# Patient Record
Sex: Male | Born: 1990 | Race: White | Hispanic: No | Marital: Married | Smoking: Never smoker
Health system: Southern US, Community
[De-identification: ages and names within clinical notes are randomized; demographics above are authoritative.]

## PROBLEM LIST (undated history)

## (undated) ENCOUNTER — Ambulatory Visit: Source: Home / Self Care

---
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
## 2010-12-22 ENCOUNTER — Encounter: Payer: Self-pay | Admitting: Pediatrics

## 2014-04-21 ENCOUNTER — Encounter: Payer: Self-pay | Admitting: Internal Medicine

## 2018-07-19 DIAGNOSIS — Z3141 Encounter for fertility testing: Secondary | ICD-10-CM | POA: Diagnosis not present

## 2018-07-22 DIAGNOSIS — R82998 Other abnormal findings in urine: Secondary | ICD-10-CM | POA: Diagnosis not present

## 2018-07-22 DIAGNOSIS — Z0000 Encounter for general adult medical examination without abnormal findings: Secondary | ICD-10-CM | POA: Diagnosis not present

## 2018-08-03 DIAGNOSIS — R740 Nonspecific elevation of levels of transaminase and lactic acid dehydrogenase [LDH]: Secondary | ICD-10-CM | POA: Diagnosis not present

## 2018-08-03 DIAGNOSIS — Z0000 Encounter for general adult medical examination without abnormal findings: Secondary | ICD-10-CM | POA: Diagnosis not present

## 2018-09-16 DIAGNOSIS — R740 Nonspecific elevation of levels of transaminase and lactic acid dehydrogenase [LDH]: Secondary | ICD-10-CM | POA: Diagnosis not present

## 2018-09-16 DIAGNOSIS — Z7189 Other specified counseling: Secondary | ICD-10-CM | POA: Diagnosis not present

## 2018-09-16 DIAGNOSIS — K5909 Other constipation: Secondary | ICD-10-CM | POA: Diagnosis not present

## 2018-12-03 DIAGNOSIS — J111 Influenza due to unidentified influenza virus with other respiratory manifestations: Secondary | ICD-10-CM | POA: Diagnosis not present

## 2018-12-03 DIAGNOSIS — R05 Cough: Secondary | ICD-10-CM | POA: Diagnosis not present

## 2018-12-03 DIAGNOSIS — Z6826 Body mass index (BMI) 26.0-26.9, adult: Secondary | ICD-10-CM | POA: Diagnosis not present

## 2020-12-04 DIAGNOSIS — F422 Mixed obsessional thoughts and acts: Secondary | ICD-10-CM | POA: Diagnosis not present

## 2020-12-12 DIAGNOSIS — F422 Mixed obsessional thoughts and acts: Secondary | ICD-10-CM | POA: Diagnosis not present

## 2020-12-19 DIAGNOSIS — F422 Mixed obsessional thoughts and acts: Secondary | ICD-10-CM | POA: Diagnosis not present

## 2020-12-26 DIAGNOSIS — F422 Mixed obsessional thoughts and acts: Secondary | ICD-10-CM | POA: Diagnosis not present

## 2021-01-02 DIAGNOSIS — F422 Mixed obsessional thoughts and acts: Secondary | ICD-10-CM | POA: Diagnosis not present

## 2021-01-16 DIAGNOSIS — F422 Mixed obsessional thoughts and acts: Secondary | ICD-10-CM | POA: Diagnosis not present

## 2021-01-30 DIAGNOSIS — F422 Mixed obsessional thoughts and acts: Secondary | ICD-10-CM | POA: Diagnosis not present

## 2022-01-30 DIAGNOSIS — H10013 Acute follicular conjunctivitis, bilateral: Secondary | ICD-10-CM | POA: Diagnosis not present

## 2022-02-18 DIAGNOSIS — H1031 Unspecified acute conjunctivitis, right eye: Secondary | ICD-10-CM | POA: Diagnosis not present

## 2022-02-18 DIAGNOSIS — Z789 Other specified health status: Secondary | ICD-10-CM | POA: Diagnosis not present

## 2022-02-19 DIAGNOSIS — H1032 Unspecified acute conjunctivitis, left eye: Secondary | ICD-10-CM | POA: Diagnosis not present

## 2022-04-07 ENCOUNTER — Ambulatory Visit
Admission: RE | Admit: 2022-04-07 | Discharge: 2022-04-07 | Disposition: A | Discharge status: Home / Self Care | Payer: BC Managed Care – PPO | Source: Ambulatory Visit | Attending: Urgent Care | Admitting: Urgent Care

## 2022-04-07 VITALS — BP 137/68 | HR 68 | Temp 98.2°F | Resp 20

## 2022-04-07 DIAGNOSIS — L03213 Periorbital cellulitis: Secondary | ICD-10-CM

## 2022-04-07 MED ORDER — CEFDINIR 300 MG PO CAPS: 300.0000 mg | ORAL_CAPSULE | Freq: Two times a day (BID) | ORAL | 0 refills | Status: AC

## 2022-04-07 NOTE — ED Provider Notes (Signed)
?  [facility] ? ? ?MRN: [id number] DOB: [birth year] ? ?Subjective:  ? ?Shane Harvey is a 31 y.o. male presenting for 1 day history of right upper eyelid redness, swelling and tenderness.  Has left upper right Romycin ointment from a previous infected stye of the left upper eyelid.  No eye trauma, foreign body sensation, contact lens use.  Patient does recall that he was really sick about a month ago with his sinuses and coughing.  Feels like he is recovered mostly.  No difficulty with vision changes, photophobia. ? ?No current facility-administered medications for this encounter. ?No current outpatient medications on file.  ? ?No Known Allergies ? ?History reviewed. No pertinent past medical history.  ? ?History reviewed. No pertinent surgical history. ? ?Family History  ?Family history unknown: Yes  ? ? ?Social History  ? ?Tobacco Use  ? Smoking status: Never  ? Smokeless tobacco: Never  ?Substance Use Topics  ? Alcohol use: Never  ? Drug use: Never  ? ? ?ROS ? ? ?Objective:  ? ?Vitals: ?BP 137/68   Pulse 68   Temp 98.2 ?F (36.8 ?C)   Resp 20   SpO2 98%  ? ?Physical Exam ?Constitutional:   ?   General: He is not in acute distress. ?   Appearance: Normal appearance. He is well-developed and normal weight. He is not ill-appearing, toxic-appearing or diaphoretic.  ?HENT:  ?   Head: Normocephalic and atraumatic.  ?   Right Ear: External ear normal.  ?   Left Ear: External ear normal.  ?   Nose: Nose normal.  ?   Mouth/Throat:  ?   Pharynx: Oropharynx is clear.  ?Eyes:  ?   General: Lids are everted, no foreign bodies appreciated. No scleral icterus.    ?   Right eye: No foreign body, discharge or hordeolum.     ?   Left eye: No foreign body, discharge or hordeolum.  ?   Extraocular Movements: Extraocular movements intact.  ?   Conjunctiva/sclera:  ?   Right eye: Right conjunctiva is not injected. No chemosis, exudate or hemorrhage. ?   Left eye: Left conjunctiva is not injected. No  chemosis, exudate or hemorrhage. ? ?Cardiovascular:  ?   Rate and Rhythm: Normal rate.  ?Pulmonary:  ?   Effort: Pulmonary effort is normal.  ?Musculoskeletal:  ?   Cervical back: Normal range of motion.  ?Neurological:  ?   Mental Status: He is alert and oriented to person, place, and time.  ?Psychiatric:     ?   Mood and Affect: Mood normal.     ?   Behavior: Behavior normal.     ?   Thought Content: Thought content normal.     ?   Judgment: Judgment normal.  ? ? ?Assessment and Plan :  ? ?PDMP not reviewed this encounter. ? ?1. Preseptal cellulitis of right upper eyelid   ? ?Recommended covering for preseptal cellulitis with cefdinir.  Use supportive care otherwise. Counseled patient on potential for adverse effects with medications prescribed/recommended today, ER and return-to-clinic precautions discussed, patient verbalized understanding. ? ?  ?Wallis Bamberg, PA-C ?04/07/22 1130 ? ?

## 2022-04-07 NOTE — ED Triage Notes (Signed)
Pt here with right eye redness and swelling since yesterday. Endorses slight blurry vision. ?

## 2022-04-21 DIAGNOSIS — H00011 Hordeolum externum right upper eyelid: Secondary | ICD-10-CM | POA: Diagnosis not present

## 2022-04-21 DIAGNOSIS — H0014 Chalazion left upper eyelid: Secondary | ICD-10-CM | POA: Diagnosis not present

## 2022-05-13 DIAGNOSIS — H00019 Hordeolum externum unspecified eye, unspecified eyelid: Secondary | ICD-10-CM | POA: Diagnosis not present

## 2022-06-16 DIAGNOSIS — H0011 Chalazion right upper eyelid: Secondary | ICD-10-CM | POA: Diagnosis not present

## 2023-02-01 DIAGNOSIS — J111 Influenza due to unidentified influenza virus with other respiratory manifestations: Secondary | ICD-10-CM | POA: Diagnosis not present

## 2023-02-02 DIAGNOSIS — R059 Cough, unspecified: Secondary | ICD-10-CM | POA: Diagnosis not present

## 2023-02-02 DIAGNOSIS — J101 Influenza due to other identified influenza virus with other respiratory manifestations: Secondary | ICD-10-CM | POA: Diagnosis not present

## 2023-02-02 DIAGNOSIS — Z1152 Encounter for screening for COVID-19: Secondary | ICD-10-CM | POA: Diagnosis not present

## 2023-02-02 DIAGNOSIS — R5383 Other fatigue: Secondary | ICD-10-CM | POA: Diagnosis not present

## 2023-12-31 ENCOUNTER — Ambulatory Visit
Admission: RE | Admit: 2023-12-31 | Discharge: 2023-12-31 | Disposition: A | Discharge status: Home / Self Care | Payer: Managed Care, Other (non HMO) | Source: Ambulatory Visit | Attending: Internal Medicine | Admitting: Internal Medicine

## 2023-12-31 ENCOUNTER — Other Ambulatory Visit: Payer: Self-pay | Admitting: Internal Medicine

## 2023-12-31 DIAGNOSIS — R1031 Right lower quadrant pain: Secondary | ICD-10-CM

## 2023-12-31 MED ORDER — IOPAMIDOL (ISOVUE-300) INJECTION 61%
100.0000 mL | Freq: Once | INTRAVENOUS | Status: AC | PRN
  Administered 2023-12-31: 100 mL via INTRAVENOUS

## 2024-02-06 ENCOUNTER — Ambulatory Visit
Admission: EM | Admit: 2024-02-06 | Discharge: 2024-02-06 | Disposition: A | Discharge status: Home / Self Care | Attending: Family Medicine | Admitting: Family Medicine

## 2024-02-06 DIAGNOSIS — B002 Herpesviral gingivostomatitis and pharyngotonsillitis: Secondary | ICD-10-CM | POA: Diagnosis not present

## 2024-02-06 MED ORDER — ACYCLOVIR 400 MG PO TABS: 400.0000 mg | ORAL_TABLET | Freq: Three times a day (TID) | ORAL | 0 refills | Status: DC

## 2024-02-06 NOTE — ED Provider Notes (Signed)
 [facility]    CSN: [id number] Arrival date & time: 02/06/24  0802      History   Chief Complaint Chief Complaint  Patient presents with   Rash    HPI Shane Harvey is a 33 y.o. male presents for facial rash.  Patient reports 2 days of a painful/itchy rash on his left upper lip that extends to his left cheek.  He states he had some upper respiratory symptoms a week or 2 before that resolved.  He denies any fevers.  No history of HSV.  No history of eczema psoriasis and no change in soaps, medications, detergents, etc.  No other concerns at this time.   Rash   History reviewed. No pertinent past medical history.  There are no active problems to display for this patient.   History reviewed. No pertinent surgical history.     Home Medications    Prior to Admission medications   Medication Sig Start Date End Date Taking? Authorizing Provider  acyclovir (ZOVIRAX) 400 MG tablet Take 1 tablet (400 mg total) by mouth 3 (three) times daily for 10 days. 02/06/24 02/16/24 Yes Radford Pax, NP  cefdinir (OMNICEF) 300 MG capsule Take 1 capsule (300 mg total) by mouth 2 (two) times daily. 04/07/22   Wallis Bamberg, PA-C    Family History Family History  Family history unknown: Yes    Social History Social History   Tobacco Use   Smoking status: Never   Smokeless tobacco: Never  Substance Use Topics   Alcohol use: Never   Drug use: Never     Allergies   Amoxicillin   Review of Systems Review of Systems  Skin:  Positive for rash.     Physical Exam Triage Vital Signs ED Triage Vitals  Encounter Vitals Group     BP 02/06/24 0816 (!) 143/78     Systolic BP Percentile --      Diastolic BP Percentile --      Pulse Rate 02/06/24 0814 82     Resp 02/06/24 0814 16     Temp 02/06/24 0814 98.1 F (36.7 C)     Temp Source 02/06/24 0814 Oral     SpO2 02/06/24 0814 98 %     Weight 02/06/24 0817 185 lb (83.9 kg)     Height 02/06/24 0817 6\' 1"  (1.854 m)      Head Circumference --      Peak Flow --      Pain Score 02/06/24 0816 2     Pain Loc --      Pain Education --      Exclude from Growth Chart --    No data found.  Updated Vital Signs BP (!) 143/78 (BP Location: Right Arm)   Pulse 82   Temp 98.1 F (36.7 C) (Oral)   Resp 16   Ht 6\' 1"  (1.854 m)   Wt 185 lb (83.9 kg)   SpO2 98%   BMI 24.41 kg/m   Visual Acuity Right Eye Distance:   Left Eye Distance:   Bilateral Distance:    Right Eye Near:   Left Eye Near:    Bilateral Near:     Physical Exam Vitals and nursing note reviewed.  Constitutional:      General: He is not in acute distress.    Appearance: Normal appearance. He is not ill-appearing.  HENT:     Head: Normocephalic and atraumatic.      Comments: There are 2 clusters of vesicular  painful rash to the left upper lip that extends to the left cheek.  There is no swelling, drainage, erythema, or. Eyes:     Pupils: Pupils are equal, round, and reactive to light.  Cardiovascular:     Rate and Rhythm: Normal rate.  Pulmonary:     Effort: Pulmonary effort is normal.  Skin:    General: Skin is warm and dry.  Neurological:     General: No focal deficit present.     Mental Status: He is alert and oriented to person, place, and time.  Psychiatric:        Mood and Affect: Mood normal.        Behavior: Behavior normal.      UC Treatments / Results  Labs (all labs ordered are listed, but only abnormal results are displayed) Labs Reviewed - No data to display  EKG   Radiology No results found.  Procedures Procedures (including critical care time)  Medications Ordered in UC Medications - No data to display  Initial Impression / Assessment and Plan / UC Course  I have reviewed the triage vital signs and the nursing notes.  Pertinent labs & imaging results that were available during my care of the patient were reviewed by me and considered in my medical decision making (see chart for details).      Reviewed exam and symptoms with patient.  Discussed rash consistent with herpes simplex infection.  Will start acyclovir.  Advised to avoid sharing food drinks or kissing anyone while symptoms persist.  Can follow-up with PCP if symptoms do not improve.  ER precautions reviewed and patient verbalized understanding. Final Clinical Impressions(s) / UC Diagnoses   Final diagnoses:  Oral herpes simplex infection     Discharge Instructions      Start Acyclovir 3 times a day for 10 days.  Avoid sharing food, drinks or kissing anyone while your symptoms persist.  Follow-up with your PCP if your symptoms do not improve.  Please go to the ER for any worsening symptoms.  Hope you feel better soon!   ED Prescriptions     Medication Sig Dispense Auth. Provider   acyclovir (ZOVIRAX) 400 MG tablet Take 1 tablet (400 mg total) by mouth 3 (three) times daily for 10 days. 30 tablet Radford Pax, NP      PDMP not reviewed this encounter.   Radford Pax, NP 02/06/24 [phone]

## 2024-02-06 NOTE — ED Triage Notes (Signed)
 Pt states rash to the left side of his face and lips for the past 2 days. Also states he has some sinus issues headache and congestion.

## 2024-02-06 NOTE — Discharge Instructions (Signed)
 Start Acyclovir 3 times a day for 10 days.  Avoid sharing food, drinks or kissing anyone while your symptoms persist.  Follow-up with your PCP if your symptoms do not improve.  Please go to the ER for any worsening symptoms.  Hope you feel better soon!

## 2024-02-07 ENCOUNTER — Emergency Department (HOSPITAL_BASED_OUTPATIENT_CLINIC_OR_DEPARTMENT_OTHER)
Admission: EM | Admit: 2024-02-07 | Discharge: 2024-02-07 | Disposition: A | Discharge status: Home / Self Care | Attending: Emergency Medicine | Admitting: Emergency Medicine

## 2024-02-07 ENCOUNTER — Encounter (HOSPITAL_BASED_OUTPATIENT_CLINIC_OR_DEPARTMENT_OTHER): Payer: Self-pay | Admitting: Urology

## 2024-02-07 DIAGNOSIS — R21 Rash and other nonspecific skin eruption: Secondary | ICD-10-CM

## 2024-02-07 DIAGNOSIS — B029 Zoster without complications: Secondary | ICD-10-CM | POA: Diagnosis not present

## 2024-02-07 MED ORDER — MUPIROCIN 2 % EX OINT: 1.0000 | TOPICAL_OINTMENT | Freq: Two times a day (BID) | CUTANEOUS | 0 refills | Status: AC

## 2024-02-07 MED ORDER — TETRACAINE HCL 0.5 % OP SOLN
2.0000 [drp] | Freq: Once | OPHTHALMIC | Status: AC
  Administered 2024-02-07: 2 [drp] via OPHTHALMIC
  Filled 2024-02-07: qty 4

## 2024-02-07 MED ORDER — FLUORESCEIN SODIUM 1 MG OP STRP
1.0000 | ORAL_STRIP | Freq: Once | OPHTHALMIC | Status: AC
  Administered 2024-02-07: 1 via OPHTHALMIC
  Filled 2024-02-07: qty 1

## 2024-02-07 MED ORDER — VALACYCLOVIR HCL 1 G PO TABS: 1000.0000 mg | ORAL_TABLET | Freq: Three times a day (TID) | ORAL | 0 refills | Status: AC

## 2024-02-07 NOTE — ED Provider Notes (Signed)
 [place] [facility] Provider Note   CSN: [id number] Arrival date & time: 02/07/24  1327     History  Chief Complaint  Patient presents with   Blister    Shane Harvey is a 33 y.o. male with no reported past medical history who presents the emergency department complaining of a facial rash.  Patient states that he had a couple bumps on the left side of his upper lip, and went to urgent care yesterday and was diagnosed with oral herpes.  Was started on acyclovir 400 mg 3 times daily.  He states today he woke up and the rash had progressed up higher on his face, just under his left eye.  Denies any eye pain or vision changes.  He had called urgent care and was recommended to come to the ER for further evaluation.  Patient did have chickenpox as a child.  States he has a 10 and 1-year-old at home, who have not received the varicella vaccination yet.  HPI     Home Medications Prior to Admission medications   Medication Sig Start Date End Date Taking? Authorizing Provider  mupirocin ointment (BACTROBAN) 2 % Apply 1 Application topically 2 (two) times daily for 7 days. 02/07/24 02/14/24 Yes Williamson Cavanah T, PA-C  valACYclovir (VALTREX) 1000 MG tablet Take 1 tablet (1,000 mg total) by mouth 3 (three) times daily for 7 days. 02/07/24 02/14/24 Yes Sharis Keeran T, PA-C  cefdinir (OMNICEF) 300 MG capsule Take 1 capsule (300 mg total) by mouth 2 (two) times daily. 04/07/22   Wallis Bamberg, PA-C      Allergies    Amoxicillin    Review of Systems   Review of Systems  Skin:  Positive for rash.  All other systems reviewed and are negative.   Physical Exam Updated Vital Signs BP 125/77 (BP Location: Right Arm)   Pulse 88   Temp 97.7 F (36.5 C) (Oral)   Resp 18   Ht 6\' 1"  (1.854 m)   Wt 83.9 kg   SpO2 100%   BMI 24.40 kg/m  Physical Exam Vitals and nursing note reviewed.  Constitutional:      Appearance: Normal appearance.  HENT:     Head:  Normocephalic and atraumatic.     Comments: Vesicular lesions noted to the left side of the face, including the left upper lip, left nasolabial fold, and under the left lower eyelid, some with yellow crusting Eyes:     Conjunctiva/sclera: Conjunctivae normal.     Pupils: Pupils are equal, round, and reactive to light.     Left eye: No fluorescein uptake.  Pulmonary:     Effort: Pulmonary effort is normal. No respiratory distress.  Skin:    General: Skin is warm and dry.  Neurological:     Mental Status: He is alert.  Psychiatric:        Mood and Affect: Mood normal.        Behavior: Behavior normal.     ED Results / Procedures / Treatments   Labs (all labs ordered are listed, but only abnormal results are displayed) Labs Reviewed - No data to display  EKG None  Radiology No results found.  Procedures Procedures    Medications Ordered in ED Medications  tetracaine (PONTOCAINE) 0.5 % ophthalmic solution 2 drop (2 drops Left Eye Given by Other 02/07/24 1459)  fluorescein ophthalmic strip 1 strip (1 strip Left Eye Given by Other 02/07/24 1500)    ED Course/ Medical  Decision Making/ A&P                                 Medical Decision Making Risk Prescription drug management.   This patient is a 33 y.o. male who presents to the ED for concern of facial rash.   Differential diagnoses prior to evaluation: Herpes simplex, herpes zoster, cellulitis, impetigo, rosacea, contact dermatitis  Past Medical History / Social History / Additional history: Chart reviewed. Pertinent results include: No reported past medical history Reviewed urgent care visit note from yesterday, was diagnosed with oral herpes and placed on 400 mg acyclovir 3 times daily  Physical Exam: Physical exam performed. The pertinent findings include: Vesicular rash noted to the left side of the face in V2 distribution, some yellow crusting, no fluorescein uptake on eye exam  Medications /  Treatment: Tetracaine drops and fluorescein strips used   Disposition: After consideration of the diagnostic results and the patients response to treatment, I feel that emergency department workup does not suggest an emergent condition requiring admission or immediate intervention beyond what has been performed at this time. The plan is: Discharge to home.  Will change antiviral regimen to higher dose for acute herpes zoster, as well as prescribing Pearson ointment for any concomitant bacterial infection. The patient is safe for discharge and has been instructed to return immediately for worsening symptoms, change in symptoms or any other concerns.  Final Clinical Impression(s) / ED Diagnoses Final diagnoses:  Herpes zoster without complication  Facial rash    Rx / DC Orders ED Discharge Orders          Ordered    valACYclovir (VALTREX) 1000 MG tablet  3 times daily        02/07/24 1516    mupirocin ointment (BACTROBAN) 2 %  2 times daily        02/07/24 1516           Portions of this report may have been transcribed using voice recognition software. Every effort was made to ensure accuracy; however, inadvertent computerized transcription errors may be present.    Su Monks, PA-C 02/07/24 1521    Alvira Monday, MD 02/07/24 [phone]

## 2024-02-07 NOTE — ED Triage Notes (Signed)
 Blisters to mouth and face and now under left eye Was seen at [facility] and told it was viral and started on acyclovir yesterday

## 2024-02-07 NOTE — Discharge Instructions (Addendum)
 You were seen in the emergency department today for facial rash.  Your symptoms look most consistent with shingles.  I have sent the antiviral to your pharmacy.  I also sent an antibacterial ointment that you can use over the crusted areas, and to cover for any bacterial infection.  Shingles can be contagious, mostly in young children, immunocompromise people, or pregnant people.  Usually it is only contagious if the lesions are open, but since hers have already crusted over and I think it is less likely to be contagious.  Still exercise caution.
